# Patient Record
Sex: Female | Born: 2005 | Race: Black or African American | Hispanic: No | Marital: Single | Smoking: Never smoker
Health system: Southern US, Community
[De-identification: ages and names within clinical notes are randomized; demographics above are authoritative.]

## PROBLEM LIST (undated history)

## (undated) HISTORY — PX: TYMPANOSTOMY TUBE PLACEMENT: SHX32

## (undated) HISTORY — PX: TONSILLECTOMY: SUR1361

---
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
## 2024-04-17 ENCOUNTER — Ambulatory Visit (INDEPENDENT_AMBULATORY_CARE_PROVIDER_SITE_OTHER): Admitting: Radiology

## 2024-04-17 ENCOUNTER — Ambulatory Visit
Admission: RE | Admit: 2024-04-17 | Discharge: 2024-04-17 | Disposition: A | Discharge status: Home / Self Care | Payer: Self-pay | Source: Ambulatory Visit

## 2024-04-17 VITALS — BP 112/75 | HR 81 | Temp 97.8°F | Resp 17

## 2024-04-17 DIAGNOSIS — B349 Viral infection, unspecified: Secondary | ICD-10-CM

## 2024-04-17 LAB — POC COVID19/FLU A&B COMBO
Covid Antigen, POC: NEGATIVE
Influenza A Antigen, POC: NEGATIVE
Influenza B Antigen, POC: NEGATIVE

## 2024-04-17 MED ORDER — PROMETHAZINE-DM 6.25-15 MG/5ML PO SYRP: 10.0000 mL | ORAL_SOLUTION | Freq: Three times a day (TID) | ORAL | 0 refills | Status: AC | PRN

## 2024-04-17 MED ORDER — AZELASTINE HCL 0.1 % NA SOLN: 1.0000 | Freq: Two times a day (BID) | NASAL | 0 refills | Status: AC

## 2024-04-17 MED ORDER — PREDNISONE 20 MG PO TABS: 40.0000 mg | ORAL_TABLET | Freq: Every day | ORAL | 0 refills | Status: AC

## 2024-04-17 NOTE — Discharge Instructions (Signed)
  1. Acute viral syndrome (Primary) - POC Covid19/Flu A&B Antigen complete in UC is negative for COVID and influenza - DG Chest 2 View x-ray completed in UC shows no acute cardiopulmonary processes, no sign of consolidation or pneumonia.  Final radiologist read still pending if any abnormality is noted patient we contacted appropriate treatment provided - azelastine (ASTELIN) 0.1 % nasal spray; Place 1 spray into both nostrils 2 (two) times daily. Use in each nostril as directed  Dispense: 30 mL; Refill: 0 - promethazine-dextromethorphan (PROMETHAZINE-DM) 6.25-15 MG/5ML syrup; Take 10 mLs by mouth 3 (three) times daily as needed for cough.  Dispense: 240 mL; Refill: 0 - predniSONE (DELTASONE) 20 MG tablet; Take 2 tablets (40 mg total) by mouth daily for 5 days.  Dispense: 10 tablet; Refill: 0  -Continue to monitor symptoms for any change in severity if there is any escalation of current symptoms or development of new symptoms follow-up in ER for further evaluation and management.

## 2024-04-17 NOTE — ED Provider Notes (Addendum)
 [facility]  Note:  This document was prepared using Dragon voice recognition software and may include unintentional dictation errors.  MRN: [id number] DOB: Oct 21, 2005  Subjective:   Vicki Lynn is a 18 y.o. female presenting for sore throat, cough, runny nose, headache, body aches x 3 to 4 days.  Patient reports first symptom was sore throat on Thursday but the next day other symptoms developed.  Patient denies taking any over-the-counter medication to treat symptoms prior to arrival in urgent care.  Patient's mother was concerned for possible pneumonia due to having viral illness about a month ago which never fully resolved and then last week developed new viral symptoms.  Patient states she did have a mild fever last week.  Denies any known sick contacts.  No shortness of breath, wheezing, weakness, dizziness, fever.  No current facility-administered medications for this encounter.  Current Outpatient Medications:    albuterol  (PROVENTIL  HFA;VENTOLIN  HFA) 108 (90 BASE) MCG/ACT inhaler, Inhale 2 puffs into the lungs every 4 (four) hours as needed for wheezing or shortness of breath (cough)., Disp: 1 Inhaler, Rfl: 0   azelastine (ASTELIN) 0.1 % nasal spray, Place 1 spray into both nostrils 2 (two) times daily. Use in each nostril as directed, Disp: 30 mL, Rfl: 0   OVER THE COUNTER MEDICATION, theraflu Honey and lemon, Disp: , Rfl:    predniSONE (DELTASONE) 20 MG tablet, Take 2 tablets (40 mg total) by mouth daily for 5 days., Disp: 10 tablet, Rfl: 0   promethazine-dextromethorphan (PROMETHAZINE-DM) 6.25-15 MG/5ML syrup, Take 10 mLs by mouth 3 (three) times daily as needed for cough., Disp: 240 mL, Rfl: 0   No Known Allergies  History reviewed. No pertinent past medical history.   Past Surgical History:  Procedure Laterality Date   TONSILLECTOMY     TYMPANOSTOMY TUBE PLACEMENT      History reviewed. No pertinent family history.  Social History   Tobacco Use    Smoking status: Never    ROS Refer to HPI for ROS details.  Objective:   Vitals: BP 112/75 (BP Location: Right Arm)   Pulse 81   Temp 97.8 F (36.6 C) (Oral)   Resp 17   LMP 03/19/2024 (Approximate)   SpO2 95%   Physical Exam Vitals and nursing note reviewed.  Constitutional:      General: She is not in acute distress.    Appearance: Normal appearance. She is well-developed. She is not ill-appearing or toxic-appearing.  HENT:     Head: Normocephalic and atraumatic.     Nose: Congestion present. No rhinorrhea.     Mouth/Throat:     Mouth: Mucous membranes are moist.  Cardiovascular:     Rate and Rhythm: Normal rate and regular rhythm.     Heart sounds: Normal heart sounds. No murmur heard. Pulmonary:     Effort: Pulmonary effort is normal. No respiratory distress.     Breath sounds: Normal breath sounds. No stridor. No wheezing, rhonchi or rales.  Chest:     Chest wall: No tenderness.  Skin:    General: Skin is warm and dry.  Neurological:     General: No focal deficit present.     Mental Status: She is alert and oriented to person, place, and time.  Psychiatric:        Mood and Affect: Mood normal.        Behavior: Behavior normal.     Procedures  Results for orders placed or performed during the hospital encounter of 04/17/24 (from  the past 24 hours)  POC Covid19/Flu A&B Antigen     Status: None   Collection Time: 04/17/24  9:06 AM  Result Value Ref Range   Influenza A Antigen, POC Negative Negative   Influenza B Antigen, POC Negative Negative   Covid Antigen, POC Negative Negative    No results found.   Assessment and Plan :     Discharge Instructions       1. Acute viral syndrome (Primary) - POC Covid19/Flu A&B Antigen complete in UC is negative for COVID and influenza - DG Chest 2 View x-ray completed in UC shows no acute cardiopulmonary processes, no sign of consolidation or pneumonia.  Final radiologist read still pending if any  abnormality is noted patient we contacted appropriate treatment provided - azelastine (ASTELIN) 0.1 % nasal spray; Place 1 spray into both nostrils 2 (two) times daily. Use in each nostril as directed  Dispense: 30 mL; Refill: 0 - promethazine-dextromethorphan (PROMETHAZINE-DM) 6.25-15 MG/5ML syrup; Take 10 mLs by mouth 3 (three) times daily as needed for cough.  Dispense: 240 mL; Refill: 0 - predniSONE (DELTASONE) 20 MG tablet; Take 2 tablets (40 mg total) by mouth daily for 5 days.  Dispense: 10 tablet; Refill: 0  -Continue to monitor symptoms for any change in severity if there is any escalation of current symptoms or development of new symptoms follow-up in ER for further evaluation and management.      Kirill Chatterjee B Oline Belk   Quentin Strebel, [place] B, NP 04/17/24 1017    Phineas Mcenroe B, NP 04/17/24 1018

## 2024-04-17 NOTE — ED Triage Notes (Signed)
 Pt states symptoms began Thursday with sore throat then the next day she developed cough, headache, runny nose, and body aches.  Today she is have deep cough, runny nose and headache
